# Patient Record
Sex: Female | Born: 1935 | Race: White | Hispanic: No | Smoking: Never smoker
Health system: Southern US, Community
[De-identification: ages and names within clinical notes are randomized; demographics above are authoritative.]

## PROBLEM LIST (undated history)

## (undated) DIAGNOSIS — E7800 Pure hypercholesterolemia, unspecified: Secondary | ICD-10-CM

## (undated) DIAGNOSIS — M109 Gout, unspecified: Secondary | ICD-10-CM

## (undated) DIAGNOSIS — I10 Essential (primary) hypertension: Secondary | ICD-10-CM

## (undated) DIAGNOSIS — F32A Depression, unspecified: Secondary | ICD-10-CM

## (undated) DIAGNOSIS — F419 Anxiety disorder, unspecified: Secondary | ICD-10-CM

## (undated) DIAGNOSIS — F329 Major depressive disorder, single episode, unspecified: Secondary | ICD-10-CM

## (undated) DIAGNOSIS — K219 Gastro-esophageal reflux disease without esophagitis: Secondary | ICD-10-CM

## (undated) DIAGNOSIS — R011 Cardiac murmur, unspecified: Secondary | ICD-10-CM

---

## 2016-02-12 ENCOUNTER — Emergency Department (HOSPITAL_COMMUNITY)
Admission: EM | Admit: 2016-02-12 | Discharge: 2016-02-12 | Disposition: A | Discharge status: Home / Self Care | Payer: Medicare Other | Attending: Emergency Medicine | Admitting: Emergency Medicine

## 2016-02-12 ENCOUNTER — Encounter (HOSPITAL_COMMUNITY): Payer: Self-pay | Admitting: Emergency Medicine

## 2016-02-12 ENCOUNTER — Emergency Department (HOSPITAL_COMMUNITY): Payer: Medicare Other

## 2016-02-12 DIAGNOSIS — W1849XA Other slipping, tripping and stumbling without falling, initial encounter: Secondary | ICD-10-CM | POA: Insufficient documentation

## 2016-02-12 DIAGNOSIS — S01511A Laceration without foreign body of lip, initial encounter: Secondary | ICD-10-CM | POA: Diagnosis not present

## 2016-02-12 DIAGNOSIS — Y999 Unspecified external cause status: Secondary | ICD-10-CM | POA: Insufficient documentation

## 2016-02-12 DIAGNOSIS — S60222A Contusion of left hand, initial encounter: Secondary | ICD-10-CM | POA: Diagnosis not present

## 2016-02-12 DIAGNOSIS — I10 Essential (primary) hypertension: Secondary | ICD-10-CM | POA: Insufficient documentation

## 2016-02-12 DIAGNOSIS — Y939 Activity, unspecified: Secondary | ICD-10-CM | POA: Insufficient documentation

## 2016-02-12 DIAGNOSIS — Y929 Unspecified place or not applicable: Secondary | ICD-10-CM | POA: Diagnosis not present

## 2016-02-12 DIAGNOSIS — S022XXA Fracture of nasal bones, initial encounter for closed fracture: Secondary | ICD-10-CM

## 2016-02-12 DIAGNOSIS — W19XXXA Unspecified fall, initial encounter: Secondary | ICD-10-CM

## 2016-02-12 DIAGNOSIS — S0993XA Unspecified injury of face, initial encounter: Secondary | ICD-10-CM | POA: Diagnosis present

## 2016-02-12 HISTORY — DX: Essential (primary) hypertension: I10

## 2016-02-12 HISTORY — DX: Pure hypercholesterolemia, unspecified: E78.00

## 2016-02-12 MED ORDER — IBUPROFEN 600 MG PO TABS: 600.0000 mg | ORAL_TABLET | Freq: Three times a day (TID) | ORAL | Status: DC | PRN

## 2016-02-12 MED ORDER — TRAMADOL HCL 50 MG PO TABS: 50.0000 mg | ORAL_TABLET | Freq: Three times a day (TID) | ORAL | Status: DC | PRN

## 2016-02-12 MED ORDER — TETANUS-DIPHTH-ACELL PERTUSSIS 5-2.5-18.5 LF-MCG/0.5 IM SUSP
0.5000 mL | Freq: Once | INTRAMUSCULAR | Status: AC
  Administered 2016-02-12: 0.5 mL via INTRAMUSCULAR
  Filled 2016-02-12: qty 0.5

## 2016-02-12 MED ORDER — AMOXICILLIN 500 MG PO CAPS: 500.0000 mg | ORAL_CAPSULE | Freq: Two times a day (BID) | ORAL | Status: DC

## 2016-02-12 MED ORDER — LIDOCAINE HCL 1 % IJ SOLN
30.0000 mL | Freq: Once | INTRAMUSCULAR | Status: AC
  Administered 2016-02-12: 20 mL
  Filled 2016-02-12: qty 40

## 2016-02-12 NOTE — ED Provider Notes (Signed)
CSN: [id number]     Arrival date & time 02/12/16  1409 History   By signing my name below, I, Marie Mueller, attest that this documentation has been prepared under the direction and in the presence of non-physician practitioner, Trixie Dredge, PA-C. Electronically Signed: Marisue Mueller, Scribe. 02/12/2016. 3:32 PM.   Chief Complaint  Patient presents with  . Fall  . Facial Injury    The history is provided by the patient. No language interpreter was used.   HPI Comments:  Marie Mueller is a 81 y.o. female with PMHx of HTN and HLD who presents to the Emergency Department s/p fall complaining of split lip and small abrasion and swelling to bridge of nose. Pt tripped over her flip flop ~2 hours ago, landing on her face and left arm on concrete floor. She notes her lip was bleeding significantly and she had a small nosebleed; bleeding controlled currently. Pt reports associated bruising and abrasions to left hand and left forearm. Unsure of last Tetanus. She reports breaking her nose ~20 years ago. Denies use of anticoagulants, dental injury, head injury, syncope, vision changes, gait difficulty, weakness, numbness, headache, neck pain, back pain, chest pain, abdominal pain, or difficulty breathing.   Past Medical History  Diagnosis Date  . Hypertension   . Hypercholesteremia    History reviewed. No pertinent past surgical history. History reviewed. No pertinent family history. Social History  Substance Use Topics  . Smoking status: Never Smoker   . Smokeless tobacco: None  . Alcohol Use: No   OB History    No data available     Review of Systems  Constitutional: Negative for activity change and fatigue.  HENT: Positive for facial swelling and nosebleeds. Negative for dental problem, rhinorrhea, sinus pressure and trouble swallowing.   Eyes: Negative for visual disturbance.  Respiratory: Negative for shortness of breath.   Cardiovascular: Negative for chest pain.   Gastrointestinal: Negative for vomiting and abdominal pain.  Musculoskeletal: Negative for back pain and neck pain.  Skin: Positive for wound.  Allergic/Immunologic: Negative for immunocompromised state.  Neurological: Negative for syncope, weakness, numbness and headaches.  Psychiatric/Behavioral: Negative for self-injury.    Allergies  Sulfa antibiotics  Home Medications   Prior to Admission medications   Not on File   BP 129/58 mmHg  Pulse 72  Temp(Src) 98.7 F (37.1 C) (Oral)  Resp 16  Ht 5\' 4"  (1.626 m)  Wt 170 lb (77.111 kg)  BMI 29.17 kg/m2  SpO2 99%   Physical Exam  Constitutional: She appears well-developed and well-nourished. No distress.  HENT:  Head: Normocephalic.  Mouth/Throat: Lacerations present.  Lacerations of both upper and lower lips; no apparent dental injury; edema and TTP over bridge of nose with tiny superficial abrasion  Neck: Neck supple.  Pulmonary/Chest: Effort normal.  Musculoskeletal:  Multiple areas of ecchymosis over dorsal left forearm and hand; TTP over 3rd MCP Left Hand: Full active range of motion of all digits, strength 5/5, sensation intact, capillary refill < 2 seconds.  Neurological: She is alert.  CN II-XII intact, EOMs intact, no pronator drift, grip strengths equal bilaterally; strength 5/5 in all extremities, sensation intact in all extremities; finger to nose, heel to shin, rapid alternating movements normal; gait is normal.  Skin: She is not diaphoretic.  Nursing note and vitals reviewed.   ED Course  Procedures   DIAGNOSTIC STUDIES:  Oxygen Saturation is 99% on RA, normal by my interpretation.    COORDINATION OF CARE:  3:26 PM Will update  Tetanus and repair lacerations to lips. Will start on antibiotics, order head CT and x-ray left hand, wrist and elbow. Discussed treatment plan with pt at bedside and pt agreed to plan.  LACERATION REPAIR PROCEDURE NOTE The patient's identification was confirmed and consent was  obtained. This procedure was performed by Trixie Dredge, PA-C at 4:38 PM. Site:  upper lip Sterile procedures observed Anesthetic used (type and amt):2 mL lidocaine 1% without epinephrine Suture type/size: 5-0 vicryl Length: 1 cm # of Sutures: 3 Technique: simple interrupted Complexity: complex Tetanus ordered Site anesthetized, irrigated with NS, explored without evidence of foreign body, wound well approximated, site covered with dry, sterile dressing.  Patient tolerated procedure well without complications. Instructions for care discussed verbally and patient provided with additional written instructions for homecare and f/u.  LACERATION REPAIR PROCEDURE NOTE The patient's identification was confirmed and consent was obtained. This procedure was performed by Trixie Dredge, PA-C at 4:38 PM. Site: Inner lower lip Sterile procedures observed Anesthetic used (type and amt): 3mL lidocaine 1% Suture type/size: 5-0 vicryl Length: 1 cm laceration external lip and 1 cm deep gaping laceration internal lower lip # of Sutures: 2 sutures placed externally.  1 suture placed internally to close gaping hole Technique:simple interrupted Complexity: complex Antibx ointment applied Tetanus ordered Site anesthetized, irrigated with NS, explored without evidence of foreign body, wound well approximated, site covered with dry, sterile dressing.  Patient tolerated procedure well without complications. Instructions for care discussed verbally and patient provided with additional written instructions for homecare and f/u.  Labs Review Labs Reviewed - No data to display  Imaging Review Dg Elbow Complete Left  02/12/2016  CLINICAL DATA:  Left elbow pain, bruising and swelling after falling today. EXAM: LEFT ELBOW - COMPLETE 3+ VIEW COMPARISON:  None. FINDINGS: The bones are mildly demineralized. There is no evidence acute fracture, dislocation or elbow joint effusion. There is some spurring of the lateral  humeral epicondyle. No focal soft tissue swelling identified. IMPRESSION: No acute osseous findings. Electronically Signed   By: Carey Bullocks M.D.   On: 02/12/2016 16:05   Dg Wrist Complete Left  02/12/2016  CLINICAL DATA:  Left hand pain, bruising and swelling after falling today. Initial encounter. EXAM: LEFT WRIST - COMPLETE 3+ VIEW COMPARISON:  None.  Hand radiographs same date are correlated. FINDINGS: The bones are demineralized. There is no evidence of acute fracture or dislocation. There are moderately advanced degenerative changes of the first [facility] joint with osteophytes and subchondral eburnation. Mild dorsal soft tissue swelling noted. IMPRESSION: No acute osseous findings.  Moderate first [facility] degenerative changes. Electronically Signed   By: Carey Bullocks M.D.   On: 02/12/2016 16:03   Ct Head Wo Contrast  02/12/2016  CLINICAL DATA:  Fall at home today. Facial injury. Initial encounter. EXAM: CT HEAD WITHOUT CONTRAST CT MAXILLOFACIAL WITHOUT CONTRAST TECHNIQUE: Multidetector CT imaging of the head and maxillofacial structures were performed using the standard protocol without intravenous contrast. Multiplanar CT image reconstructions of the maxillofacial structures were also generated. COMPARISON:  None. FINDINGS: CT HEAD FINDINGS Brain: There is no evidence of acute intracranial hemorrhage, mass lesion, brain edema or extra-axial fluid collection. The ventricles and subarachnoid spaces are appropriately sized for age. There is no CT evidence of acute cortical infarction. There is minimal periventricular low-density, likely due to chronic small vessel ischemic changes. Intracranial vascular calcifications are noted. Bones/sinuses/visualized face: Facial findings are described below. The calvarium is intact. CT MAXILLOFACIAL FINDINGS There are bilateral nasal bone fractures, likely acute  and mildly displaced, worse on the right. No other evidence of acute facial fracture. The mandible and  temporomandibular joints are intact. There is mucosal thickening in the maxillary sinuses bilaterally without air-fluid levels. The additional paranasal sinuses and middle ears are well aerated. The mastoid air cells are partially opacified, greater on the left. No evidence of orbital hematoma. There is some soft tissue swelling over the nose. IMPRESSION: 1. No acute intracranial findings. 2. Bilateral nasal bone fractures, mildly displaced. No other acute facial findings. 3. Mild mucosal thickening in the maxillary sinuses bilaterally. Partial mastoid air cell opacification bilaterally. Electronically Signed   By: Carey BullocksWilliam  Veazey M.D.   On: 02/12/2016 16:21   Dg Hand Complete Left  02/12/2016  CLINICAL DATA:  Left hand pain, bruising and swelling after falling today. Initial encounter. EXAM: LEFT HAND - COMPLETE 3+ VIEW COMPARISON:  None. FINDINGS: The bones are demineralized. There is no evidence of acute fracture or dislocation. There are mild interphalangeal degenerative changes. There are greater degenerative changes at the first [facility] articulation. Mild dorsal soft tissue swelling noted. IMPRESSION: No acute osseous findings. Degenerative changes as described, greatest at the first [facility] joint. Electronically Signed   By: Carey BullocksWilliam  Veazey M.D.   On: 02/12/2016 16:02   Ct Maxillofacial Wo Cm  02/12/2016  CLINICAL DATA:  Fall at home today. Facial injury. Initial encounter. EXAM: CT HEAD WITHOUT CONTRAST CT MAXILLOFACIAL WITHOUT CONTRAST TECHNIQUE: Multidetector CT imaging of the head and maxillofacial structures were performed using the standard protocol without intravenous contrast. Multiplanar CT image reconstructions of the maxillofacial structures were also generated. COMPARISON:  None. FINDINGS: CT HEAD FINDINGS Brain: There is no evidence of acute intracranial hemorrhage, mass lesion, brain edema or extra-axial fluid collection. The ventricles and subarachnoid spaces are appropriately sized for age.  There is no CT evidence of acute cortical infarction. There is minimal periventricular low-density, likely due to chronic small vessel ischemic changes. Intracranial vascular calcifications are noted. Bones/sinuses/visualized face: Facial findings are described below. The calvarium is intact. CT MAXILLOFACIAL FINDINGS There are bilateral nasal bone fractures, likely acute and mildly displaced, worse on the right. No other evidence of acute facial fracture. The mandible and temporomandibular joints are intact. There is mucosal thickening in the maxillary sinuses bilaterally without air-fluid levels. The additional paranasal sinuses and middle ears are well aerated. The mastoid air cells are partially opacified, greater on the left. No evidence of orbital hematoma. There is some soft tissue swelling over the nose. IMPRESSION: 1. No acute intracranial findings. 2. Bilateral nasal bone fractures, mildly displaced. No other acute facial findings. 3. Mild mucosal thickening in the maxillary sinuses bilaterally. Partial mastoid air cell opacification bilaterally. Electronically Signed   By: Carey BullocksWilliam  Veazey M.D.   On: 02/12/2016 16:21      EKG Interpretation None      MDM   Final diagnoses:  Fall, initial encounter  Lip laceration, initial encounter  Nasal bone fracture, closed, initial encounter  Hand contusion, left, initial encounter   Afebrile, nontoxic patient with injury to her face and left arm while tripping and falling due to getting her flip flop caught.   Xrays and CT demonstrate nasal fracture only.  No active epistaxis.  Nares patent.  Lip lacerations repaired in ED.  Left arm without fracture.  Pt able to use hand and are normally.  Neurovascularly intact.   D/C home with amoxicillin, motrin, tramadol.  Pt will stay with daughters tonight.  Daughter is nurse, aware of concern for sedating pain  medications.  Discussed result, findings, treatment, and follow up  with patient.  Pt given return  precautions.  Pt verbalizes understanding and agrees with plan.      I personally performed the services described in this documentation, which was scribed in my presence. The recorded information has been reviewed and is accurate.    Trixie Dredge, PA-C 02/12/16 1758  Raeford Razor, MD 02/14/16 2159

## 2016-02-12 NOTE — ED Notes (Signed)
Pt tripped over flip flop at 1300 today. Landed on face and L arm. Pt has nose deformity and split lip. Has bruises on L knuckles, wrist and elbow. No trouble moving arm/hand.

## 2016-02-12 NOTE — Discharge Instructions (Signed)
Read the information below.  Use the prescribed medication as directed.  Please discuss all new medications with your pharmacist.  You may return to the Emergency Department at any time for worsening condition or any new symptoms that concern you.     If you develop redness, swelling, pus draining from the wound, or fevers greater than 100.4, return to the ER immediately for a recheck.   ° ° ° °You have had a head injury which does not appear to require admission at this time. A concussion is a state of changed mental ability from trauma. °SEEK IMMEDIATE MEDICAL ATTENTION IF: °There is confusion or drowsiness (although children frequently become drowsy after injury).  °You cannot awaken the injured person.  °There is nausea (feeling sick to your stomach) or continued, forceful vomiting.  °You notice dizziness or unsteadiness which is getting worse, or inability to walk.  °You have convulsions or unconsciousness.  °You experience severe, persistent headaches not relieved by Tylenol?. (Do not take aspirin as this impairs clotting abilities). Take other pain medications only as directed.  °You cannot use arms or legs normally.  °There are changes in pupil sizes. (This is the black center in the colored part of the eye)  °There is clear or bloody discharge from the nose or ears.  °Change in speech, vision, swallowing, or understanding.  °Localized weakness, numbness, tingling, or change in bowel or bladder control.  °

## 2018-01-11 ENCOUNTER — Emergency Department (HOSPITAL_COMMUNITY)
Admission: EM | Admit: 2018-01-11 | Discharge: 2018-01-11 | Disposition: A | Discharge status: Home / Self Care | Payer: Medicare Other | Attending: Emergency Medicine | Admitting: Emergency Medicine

## 2018-01-11 ENCOUNTER — Emergency Department (HOSPITAL_COMMUNITY): Payer: Medicare Other

## 2018-01-11 ENCOUNTER — Encounter (HOSPITAL_COMMUNITY): Payer: Self-pay

## 2018-01-11 ENCOUNTER — Other Ambulatory Visit: Payer: Self-pay

## 2018-01-11 DIAGNOSIS — M79605 Pain in left leg: Secondary | ICD-10-CM | POA: Diagnosis not present

## 2018-01-11 DIAGNOSIS — X58XXXA Exposure to other specified factors, initial encounter: Secondary | ICD-10-CM | POA: Diagnosis not present

## 2018-01-11 DIAGNOSIS — Z79899 Other long term (current) drug therapy: Secondary | ICD-10-CM | POA: Insufficient documentation

## 2018-01-11 DIAGNOSIS — T148XXA Other injury of unspecified body region, initial encounter: Secondary | ICD-10-CM

## 2018-01-11 DIAGNOSIS — S7012XA Contusion of left thigh, initial encounter: Secondary | ICD-10-CM | POA: Insufficient documentation

## 2018-01-11 DIAGNOSIS — Y939 Activity, unspecified: Secondary | ICD-10-CM | POA: Diagnosis not present

## 2018-01-11 DIAGNOSIS — I10 Essential (primary) hypertension: Secondary | ICD-10-CM | POA: Diagnosis not present

## 2018-01-11 DIAGNOSIS — S79922A Unspecified injury of left thigh, initial encounter: Secondary | ICD-10-CM | POA: Diagnosis present

## 2018-01-11 DIAGNOSIS — Y999 Unspecified external cause status: Secondary | ICD-10-CM | POA: Insufficient documentation

## 2018-01-11 DIAGNOSIS — Y929 Unspecified place or not applicable: Secondary | ICD-10-CM | POA: Diagnosis not present

## 2018-01-11 HISTORY — DX: Gout, unspecified: M10.9

## 2018-01-11 HISTORY — DX: Cardiac murmur, unspecified: R01.1

## 2018-01-11 HISTORY — DX: Depression, unspecified: F32.A

## 2018-01-11 HISTORY — DX: Major depressive disorder, single episode, unspecified: F32.9

## 2018-01-11 HISTORY — DX: Anxiety disorder, unspecified: F41.9

## 2018-01-11 HISTORY — DX: Gastro-esophageal reflux disease without esophagitis: K21.9

## 2018-01-11 LAB — CBC WITH DIFFERENTIAL/PLATELET
Basophils Absolute: 0 10*3/uL (ref 0.0–0.1)
Basophils Relative: 0 %
EOS ABS: 0.9 10*3/uL — AB (ref 0.0–0.7)
Eosinophils Relative: 7 %
HCT: 36.1 % (ref 36.0–46.0)
HEMOGLOBIN: 10.9 g/dL — AB (ref 12.0–15.0)
LYMPHS PCT: 25 %
Lymphs Abs: 3.5 10*3/uL (ref 0.7–4.0)
MCH: 22.8 pg — ABNORMAL LOW (ref 26.0–34.0)
MCHC: 30.2 g/dL (ref 30.0–36.0)
MCV: 75.4 fL — AB (ref 78.0–100.0)
Monocytes Absolute: 1.1 10*3/uL — ABNORMAL HIGH (ref 0.1–1.0)
Monocytes Relative: 8 %
NEUTROS ABS: 8.3 10*3/uL — AB (ref 1.7–7.7)
NEUTROS PCT: 60 %
Platelets: 331 10*3/uL (ref 150–400)
RBC: 4.79 MIL/uL (ref 3.87–5.11)
RDW: 16.8 % — ABNORMAL HIGH (ref 11.5–15.5)
WBC: 13.8 10*3/uL — AB (ref 4.0–10.5)

## 2018-01-11 LAB — COMPREHENSIVE METABOLIC PANEL
ALK PHOS: 66 U/L (ref 38–126)
ALT: 12 U/L — AB (ref 14–54)
AST: 18 U/L (ref 15–41)
Albumin: 3.4 g/dL — ABNORMAL LOW (ref 3.5–5.0)
Anion gap: 6 (ref 5–15)
BUN: 12 mg/dL (ref 6–20)
CALCIUM: 8.7 mg/dL — AB (ref 8.9–10.3)
CO2: 30 mmol/L (ref 22–32)
CREATININE: 0.79 mg/dL (ref 0.44–1.00)
Chloride: 99 mmol/L — ABNORMAL LOW (ref 101–111)
GFR calc non Af Amer: >60 mL/min (ref >60)
GLUCOSE: 91 mg/dL (ref 65–99)
Potassium: 3.8 mmol/L (ref 3.5–5.1)
SODIUM: 135 mmol/L (ref 135–145)
Total Bilirubin: 1.1 mg/dL (ref 0.3–1.2)
Total Protein: 5.8 g/dL — ABNORMAL LOW (ref 6.5–8.1)

## 2018-01-11 LAB — PROTIME-INR
INR: 1.09
PROTHROMBIN TIME: 14 s (ref 11.4–15.2)

## 2018-01-11 NOTE — ED Triage Notes (Signed)
Pt reports that bruising to left upper inner thigh started Saturday, then noticed bruising to left upper outer thigh Monday. No injury and denies blood thinners

## 2018-01-11 NOTE — ED Notes (Signed)
Pt returned from ultrasound

## 2018-01-11 NOTE — ED Notes (Signed)
Pt states has history of venous statis ulcers to left leg, healed at this time. Pt last had treatment since October of last year.

## 2018-01-11 NOTE — Discharge Instructions (Addendum)
Stop taking the Aleve and follow-up with your family doctor in a week for recheck

## 2018-01-11 NOTE — ED Provider Notes (Signed)
[facility] EMERGENCY DEPARTMENT Provider Note   CSN: [id number] Arrival date & time: 01/11/18  1217     History   Chief Complaint Chief Complaint  Patient presents with  . Bleeding/Bruising    HPI Marie Mueller is a 83 y.o. female.  Patient complains of bruising to her left thigh.  Patient states she has had some falls but she could not remember if she had a recent fall  The history is provided by the patient. No language interpreter was used.  Illness  This is a new problem. The current episode started more than 2 days ago. The problem occurs rarely. The problem has been resolved. Pertinent negatives include no chest pain, no abdominal pain and no headaches. Nothing aggravates the symptoms. Nothing relieves the symptoms. She has tried nothing for the symptoms. The treatment provided no relief.    Past Medical History:  Diagnosis Date  . Anxiety   . Depression   . GERD (gastroesophageal reflux disease)   . Gout   . Heart murmur    question mitral valve problems  . Hypercholesteremia   . Hypertension     There are no active problems to display for this patient.   History reviewed. No pertinent surgical history.   OB History   None      Home Medications    Prior to Admission medications   Medication Sig Start Date End Date Taking? Authorizing Provider  allopurinol (ZYLOPRIM) 300 MG tablet Take 300 mg by mouth daily.   Yes [provider]  benazepril (LOTENSIN) 10 MG tablet Take 10 mg by mouth daily.   Yes [provider]  escitalopram (LEXAPRO) 20 MG tablet Take 20 mg by mouth daily.   Yes [provider]  meloxicam (MOBIC) 7.5 MG tablet Take 7.5 mg by mouth daily.   Yes [provider]  metoprolol tartrate (LOPRESSOR) 25 MG tablet Take 25 mg by mouth 2 (two) times daily.   Yes [provider]  omeprazole (PRILOSEC) 20 MG capsule Take 20 mg by mouth daily.   Yes [provider]    Family History No  family history on file.  Social History Social History   Tobacco Use  . Smoking status: Never Smoker  Substance Use Topics  . Alcohol use: No  . Drug use: Not on file     Allergies   Sulfa antibiotics   Review of Systems Review of Systems  Constitutional: Negative for appetite change and fatigue.  HENT: Negative for congestion, ear discharge and sinus pressure.   Eyes: Negative for discharge.  Respiratory: Negative for cough.   Cardiovascular: Negative for chest pain.  Gastrointestinal: Negative for abdominal pain and diarrhea.  Genitourinary: Negative for frequency and hematuria.  Musculoskeletal: Negative for back pain.       Bruising over left thigh  Skin: Negative for rash.  Neurological: Negative for seizures and headaches.  Psychiatric/Behavioral: Negative for hallucinations.     Physical Exam Updated Vital Signs BP (!) 149/63   Pulse 65   Temp 97.6 F (36.4 C) (Oral)   Resp 18   Ht  (1.626 m)   Wt 78.5 kg (173 lb)   SpO2 95%   BMI 29.70 kg/m   Physical Exam  Constitutional: She is oriented to person, place, and time. She appears well-developed.  HENT:  Head: Normocephalic.  Eyes: Conjunctivae and EOM are normal. No scleral icterus.  Neck: Neck supple. No thyromegaly present.  Cardiovascular: Normal rate and regular rhythm. Exam reveals no  gallop and no friction rub.  No murmur heard. Pulmonary/Chest: No stridor. She has no wheezes. She has no rales. She exhibits no tenderness.  Abdominal: She exhibits no distension. There is no tenderness. There is no rebound.  Musculoskeletal: Normal range of motion. She exhibits no edema.  Mild bruising to upper inner left thigh and mid left lateral left thigh.  Neurovascular exam is normal  Lymphadenopathy:    She has no cervical adenopathy.  Neurological: She is oriented to person, place, and time. She exhibits normal muscle tone. Coordination normal.  Skin: No rash noted. No erythema.  Psychiatric: She  has a normal mood and affect. Her behavior is normal.     ED Treatments / Results  Labs (all labs ordered are listed, but only abnormal results are displayed) Labs Reviewed  CBC WITH DIFFERENTIAL/PLATELET - Abnormal; Notable for the following components:      Result Value   WBC 13.8 (*)    Hemoglobin 10.9 (*)    MCV 75.4 (*)    MCH 22.8 (*)    RDW 16.8 (*)    Neutro Abs 8.3 (*)    Monocytes Absolute 1.1 (*)    Eosinophils Absolute 0.9 (*)    All other components within normal limits  COMPREHENSIVE METABOLIC PANEL - Abnormal; Notable for the following components:   Chloride 99 (*)    Calcium 8.7 (*)    Total Protein 5.8 (*)    Albumin 3.4 (*)    ALT 12 (*)    All other components within normal limits  PROTIME-INR    EKG None  Radiology US Venous Img Lower Unilateral Left  Result Date: 01/11/2018 CLINICAL DATA:  83 year old female with left lower extremity pain EXAM: LEFT LOWER EXTREMITY VENOUS DOPPLER ULTRASOUND TECHNIQUE: Gray-scale sonography with graded compression, as well as color Doppler and duplex ultrasound were performed to evaluate the lower extremity deep venous systems from the level of the common femoral vein and including the common femoral, femoral, profunda femoral, popliteal and calf veins including the posterior tibial, peroneal and gastrocnemius veins when visible. The superficial great saphenous vein was also interrogated. Spectral Doppler was utilized to evaluate flow at rest and with distal augmentation maneuvers in the common femoral, femoral and popliteal veins. COMPARISON:  None. FINDINGS: Contralateral Common Femoral Vein: Respiratory phasicity is normal and symmetric with the symptomatic side. No evidence of thrombus. Normal compressibility. Common Femoral Vein: No evidence of thrombus. Normal compressibility, respiratory phasicity and response to augmentation. Saphenofemoral Junction: No evidence of thrombus. Normal compressibility and flow on color  Doppler imaging. Profunda Femoral Vein: No evidence of thrombus. Normal compressibility and flow on color Doppler imaging. Femoral Vein: No evidence of thrombus. Normal compressibility, respiratory phasicity and response to augmentation. Popliteal Vein: No evidence of thrombus. Normal compressibility, respiratory phasicity and response to augmentation. Calf Veins: No evidence of thrombus. Normal compressibility and flow on color Doppler imaging. Superficial Great Saphenous Vein: No evidence of thrombus. Normal compressibility. Venous Reflux:  None. Other Findings:  None. IMPRESSION: No evidence of deep venous thrombosis. Electronically Signed   By: Malachy Moan M.D.   On: 01/11/2018 14:53    Procedures Procedures (including critical care time)  Medications Ordered in ED Medications - No data to display   Initial Impression / Assessment and Plan / ED Course  I have reviewed the triage vital signs and the nursing notes.  Pertinent labs & imaging results that were available during my care of the patient were reviewed by me and considered in  my medical decision making (see chart for details).    CBC including platelets normal chemistries normal PT normal.  Ultrasound venous left leg normal.  Patient has been taking Mobic and Aleve.  She is taking no other possible blood thinners.  She has been instructed to stop the Aleve and follow-up with your doctor in a week for recheck  Final Clinical Impressions(s) / ED Diagnoses   Final diagnoses:  Bruising    ED Discharge Orders    None       Bethann Berkshire, MD 01/11/18 1534

## 2018-01-11 NOTE — ED Notes (Signed)
Pt up to bathroom at this time. Going to ultrasound.

## 2018-11-30 IMAGING — US US EXTREM LOW VENOUS*L*
1 series · 13 of 24 positions shown · non-contrast
Comparison: None.

CLINICAL DATA: 82-year-old female with left lower extremity pain



[Series 1: us extrem low venous*left* · 0.08mm/px · 13 of 35 slices shown]
[im 1/35]
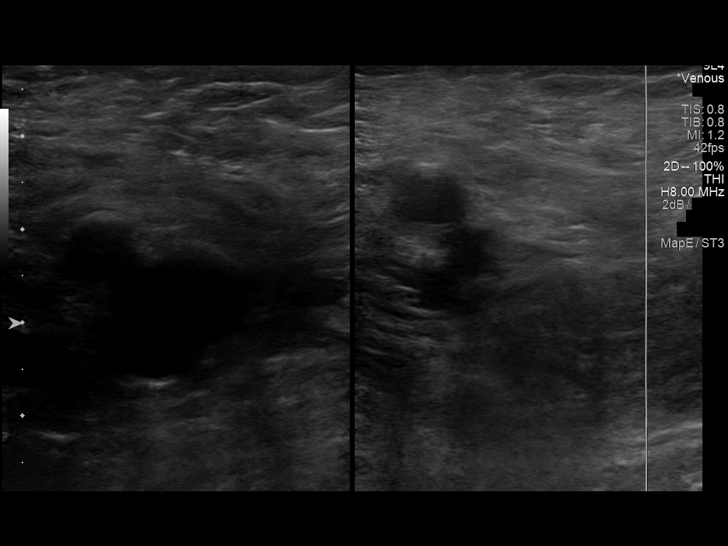
[im 3/35]
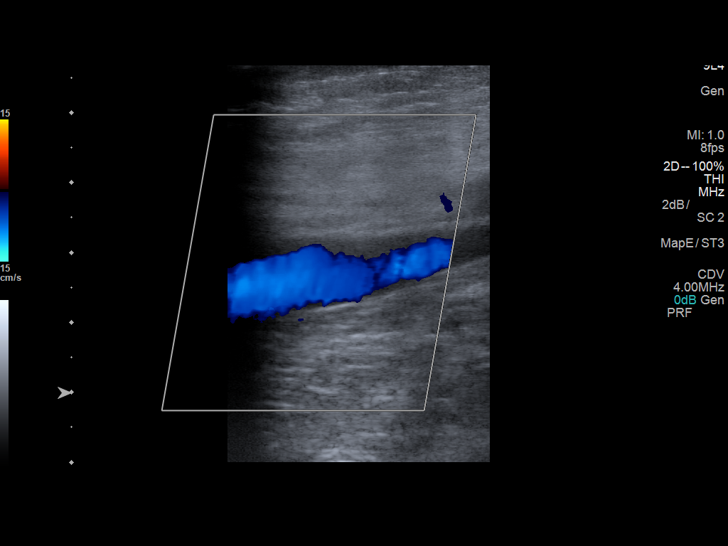
[im 6/35]
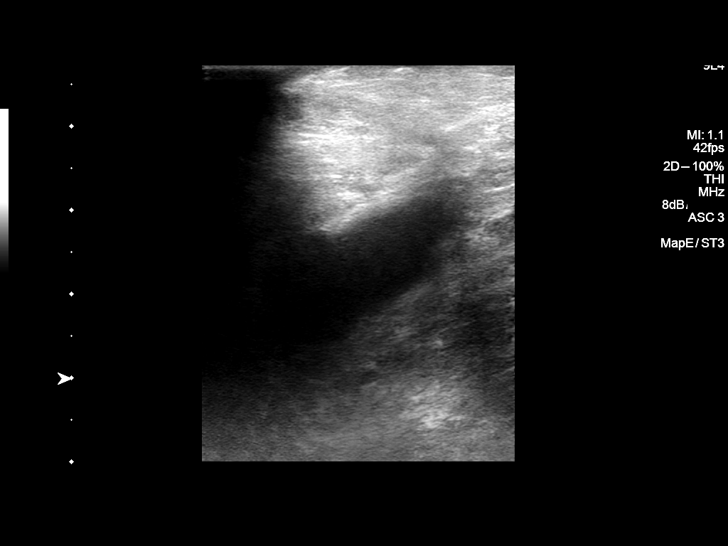
[im 9/35]
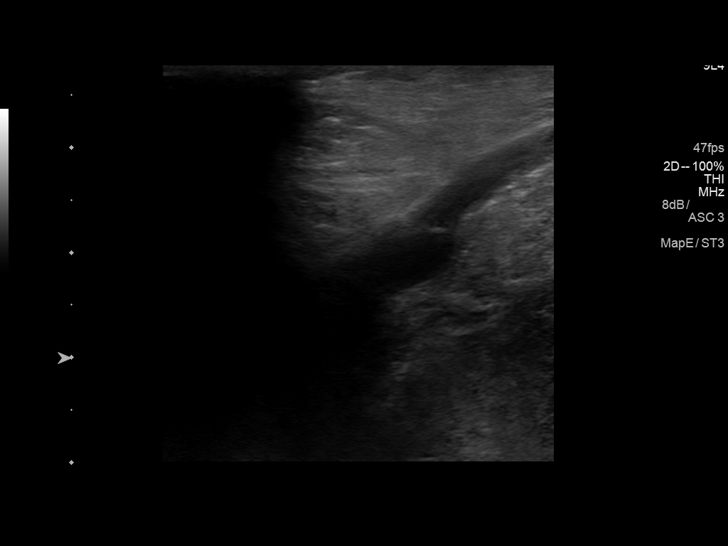
[im 12/35]
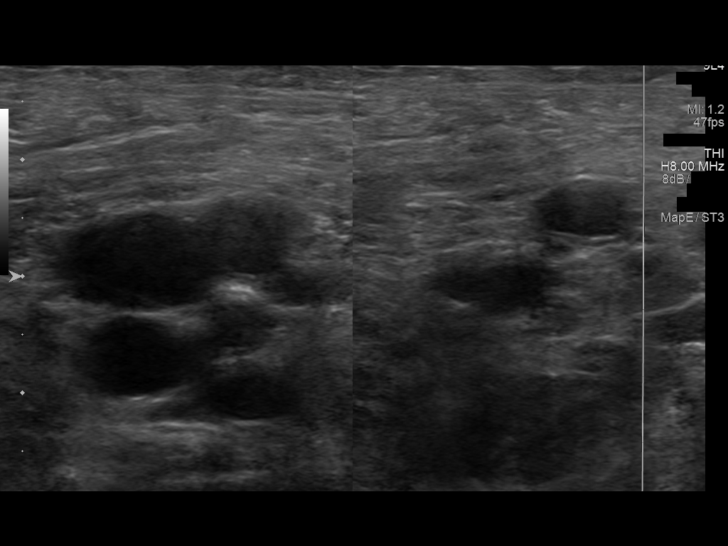
[im 15/35]
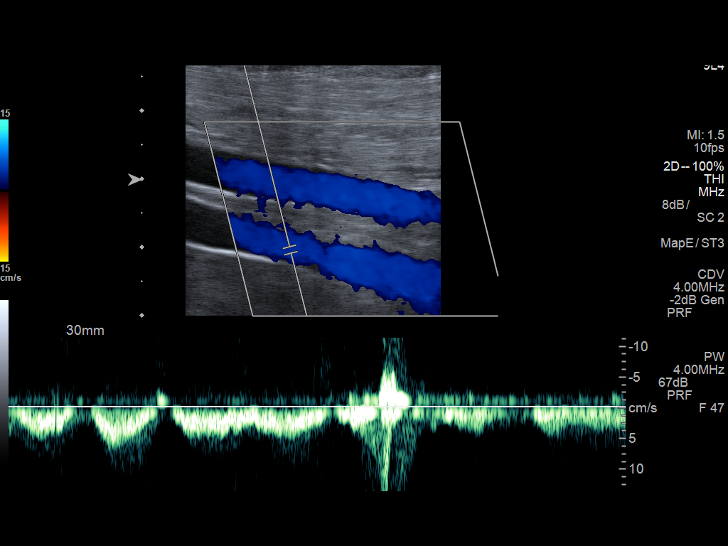
[im 18/35]
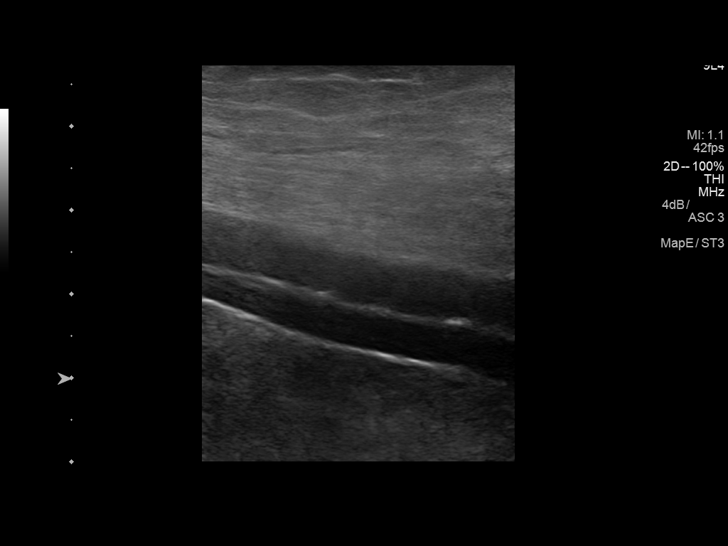
[im 20/35]
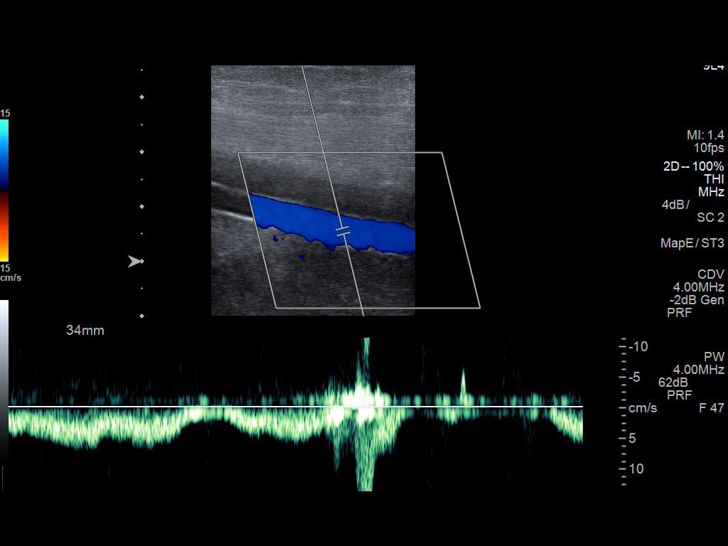
[im 23/35]
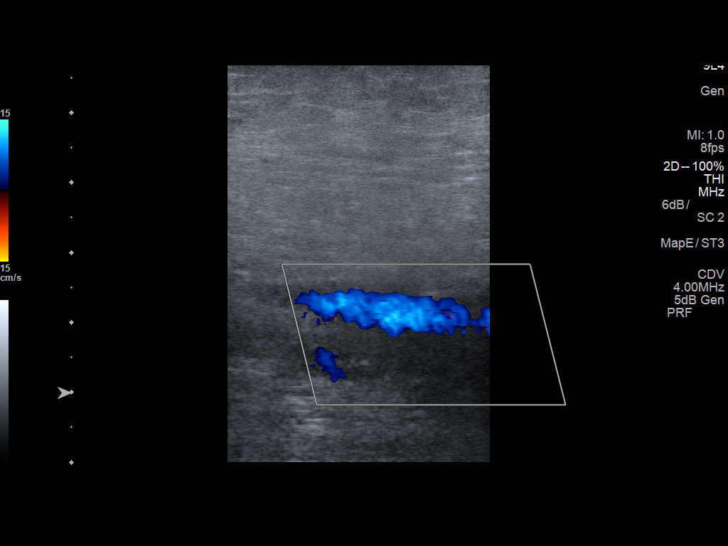
[im 26/35]
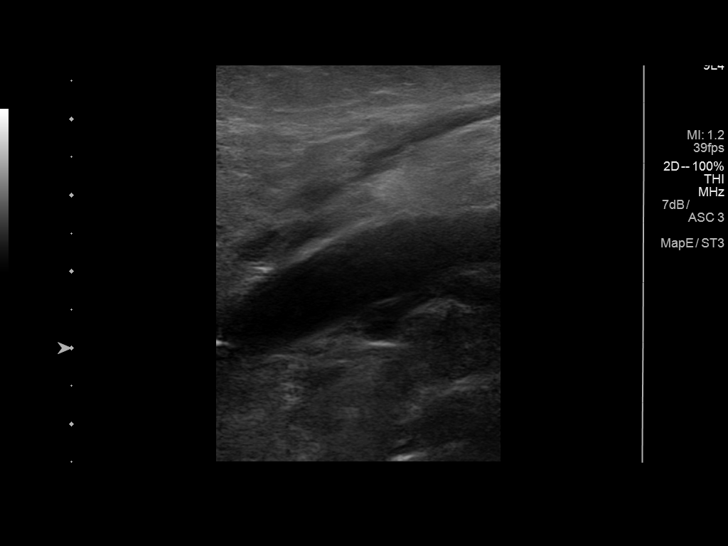
[im 29/35]
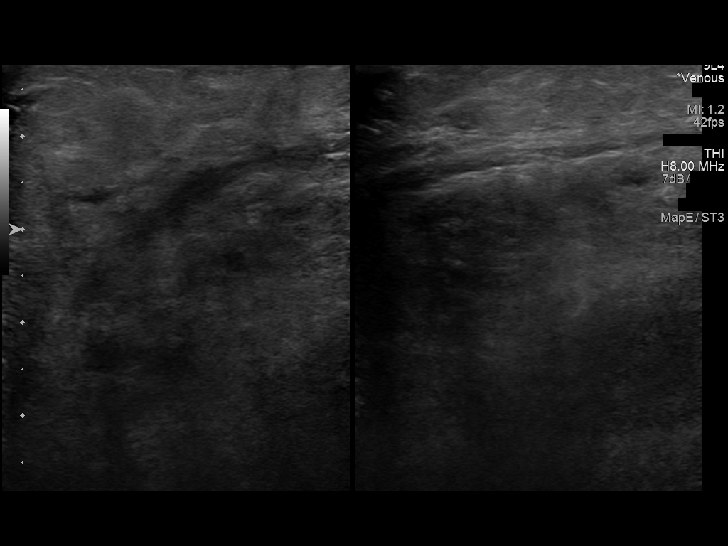
[im 32/35]
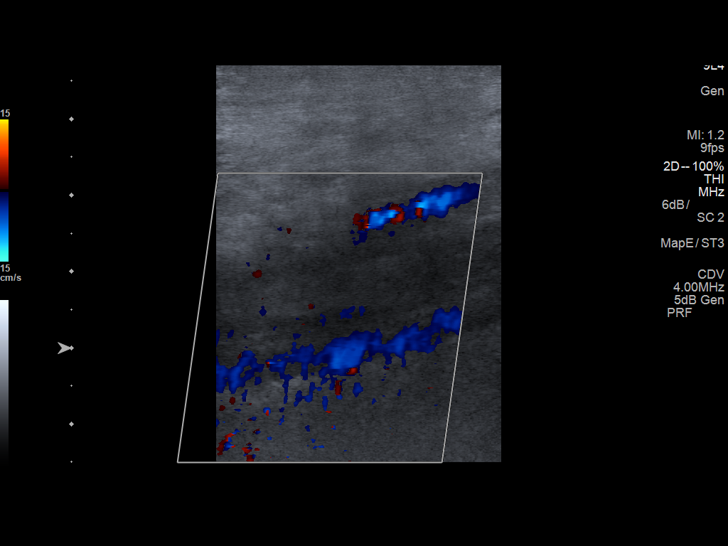
[im 35/35]
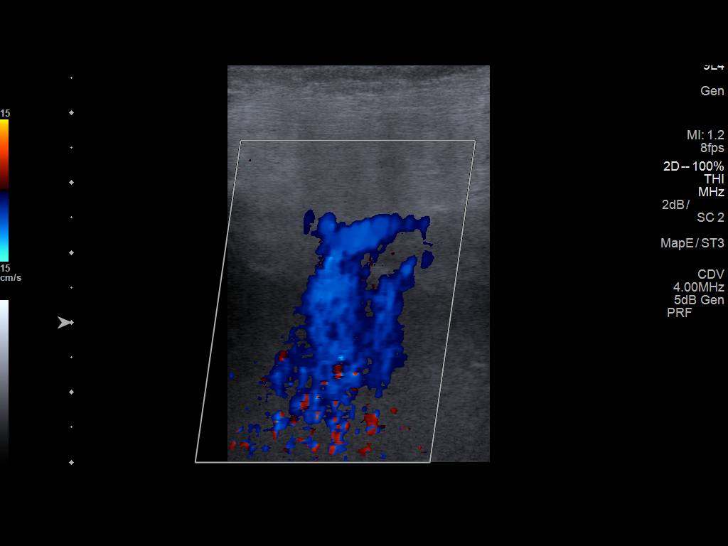

[13 of 24 positions shown; findings below may reference images not displayed]

FINDINGS: Contralateral Common Femoral Vein: Respiratory phasicity is normal
and symmetric with the symptomatic side. No evidence of thrombus.
Normal compressibility.

Common Femoral Vein: No evidence of thrombus. Normal
compressibility, respiratory phasicity and response to augmentation.

Saphenofemoral Junction: No evidence of thrombus. Normal
compressibility and flow on color Doppler imaging.

Profunda Femoral Vein: No evidence of thrombus. Normal
compressibility and flow on color Doppler imaging.

Femoral Vein: No evidence of thrombus. Normal compressibility,
respiratory phasicity and response to augmentation.

Popliteal Vein: No evidence of thrombus. Normal compressibility,
respiratory phasicity and response to augmentation.

Calf Veins: No evidence of thrombus. Normal compressibility and flow
on color Doppler imaging.

Superficial Great Saphenous Vein: No evidence of thrombus. Normal
compressibility.

Venous Reflux:  None.

Other Findings:  None.
IMPRESSION: No evidence of deep venous thrombosis.

## 2021-01-22 DEATH — deceased
